# Patient Record
Sex: Male | Born: 1960 | Race: Black or African American | Hispanic: No | Smoking: Former smoker
Health system: Southern US, Community
[De-identification: ages and names within clinical notes are randomized; demographics above are authoritative.]

## PROBLEM LIST (undated history)

## (undated) DIAGNOSIS — I10 Essential (primary) hypertension: Secondary | ICD-10-CM

---
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
## 2017-12-05 ENCOUNTER — Other Ambulatory Visit: Payer: Self-pay

## 2017-12-05 ENCOUNTER — Encounter (HOSPITAL_COMMUNITY): Payer: Self-pay | Admitting: Emergency Medicine

## 2017-12-05 ENCOUNTER — Observation Stay (HOSPITAL_COMMUNITY)
Admission: EM | Admit: 2017-12-05 | Discharge: 2017-12-06 | Disposition: A | Discharge status: Home / Self Care | Payer: Self-pay | Attending: Oncology | Admitting: Oncology

## 2017-12-05 DIAGNOSIS — T782XXA Anaphylactic shock, unspecified, initial encounter: Secondary | ICD-10-CM | POA: Diagnosis present

## 2017-12-05 DIAGNOSIS — I252 Old myocardial infarction: Secondary | ICD-10-CM | POA: Insufficient documentation

## 2017-12-05 DIAGNOSIS — Z87891 Personal history of nicotine dependence: Secondary | ICD-10-CM | POA: Insufficient documentation

## 2017-12-05 DIAGNOSIS — T886XXA Anaphylactic reaction due to adverse effect of correct drug or medicament properly administered, initial encounter: Principal | ICD-10-CM | POA: Insufficient documentation

## 2017-12-05 DIAGNOSIS — T464X5A Adverse effect of angiotensin-converting-enzyme inhibitors, initial encounter: Secondary | ICD-10-CM | POA: Insufficient documentation

## 2017-12-05 DIAGNOSIS — I10 Essential (primary) hypertension: Secondary | ICD-10-CM | POA: Insufficient documentation

## 2017-12-05 DIAGNOSIS — T783XXA Angioneurotic edema, initial encounter: Secondary | ICD-10-CM

## 2017-12-05 DIAGNOSIS — I2510 Atherosclerotic heart disease of native coronary artery without angina pectoris: Secondary | ICD-10-CM | POA: Insufficient documentation

## 2017-12-05 DIAGNOSIS — X58XXXA Exposure to other specified factors, initial encounter: Secondary | ICD-10-CM | POA: Insufficient documentation

## 2017-12-05 HISTORY — DX: Essential (primary) hypertension: I10

## 2017-12-05 LAB — BASIC METABOLIC PANEL
ANION GAP: 6 (ref 5–15)
BUN: 12 mg/dL (ref 6–20)
CHLORIDE: 109 mmol/L (ref 101–111)
CO2: 23 mmol/L (ref 22–32)
CREATININE: 1.09 mg/dL (ref 0.61–1.24)
Calcium: 8.6 mg/dL — ABNORMAL LOW (ref 8.9–10.3)
GFR calc non Af Amer: >60 mL/min (ref >60)
Glucose, Bld: 119 mg/dL — ABNORMAL HIGH (ref 65–99)
POTASSIUM: 4.3 mmol/L (ref 3.5–5.1)
Sodium: 138 mmol/L (ref 135–145)

## 2017-12-05 LAB — CBC WITH DIFFERENTIAL/PLATELET
ABS IMMATURE GRANULOCYTES: 0 10*3/uL (ref 0.0–0.1)
BASOS ABS: 0.1 10*3/uL (ref 0.0–0.1)
Basophils Relative: 1 %
EOS PCT: 1 %
Eosinophils Absolute: 0.1 10*3/uL (ref 0.0–0.7)
HEMATOCRIT: 49 % (ref 39.0–52.0)
HEMOGLOBIN: 15.6 g/dL (ref 13.0–17.0)
IMMATURE GRANULOCYTES: 0 %
LYMPHS ABS: 2.1 10*3/uL (ref 0.7–4.0)
LYMPHS PCT: 25 %
MCH: 27.1 pg (ref 26.0–34.0)
MCHC: 31.8 g/dL (ref 30.0–36.0)
MCV: 85.2 fL (ref 78.0–100.0)
Monocytes Absolute: 0.6 10*3/uL (ref 0.1–1.0)
Monocytes Relative: 8 %
NEUTROS ABS: 5.6 10*3/uL (ref 1.7–7.7)
NEUTROS PCT: 65 %
Platelets: 249 10*3/uL (ref 150–400)
RBC: 5.75 MIL/uL (ref 4.22–5.81)
RDW: 15.9 % — ABNORMAL HIGH (ref 11.5–15.5)
WBC: 8.4 10*3/uL (ref 4.0–10.5)

## 2017-12-05 MED ORDER — METHYLPREDNISOLONE SODIUM SUCC 125 MG IJ SOLR
125.0000 mg | Freq: Once | INTRAMUSCULAR | Status: AC
  Administered 2017-12-05: 125 mg via INTRAVENOUS
  Filled 2017-12-05: qty 2

## 2017-12-05 MED ORDER — DIPHENHYDRAMINE HCL 50 MG/ML IJ SOLN
25.0000 mg | Freq: Once | INTRAMUSCULAR | Status: AC
  Administered 2017-12-05: 25 mg via INTRAVENOUS
  Filled 2017-12-05: qty 1

## 2017-12-05 MED ORDER — ENOXAPARIN SODIUM 40 MG/0.4ML ~~LOC~~ SOLN
40.0000 mg | SUBCUTANEOUS | Status: DC
  Administered 2017-12-05: 40 mg via SUBCUTANEOUS
  Filled 2017-12-05: qty 0.4

## 2017-12-05 MED ORDER — ACETAMINOPHEN 500 MG PO TABS
1000.0000 mg | ORAL_TABLET | Freq: Four times a day (QID) | ORAL | Status: DC | PRN
  Administered 2017-12-05 – 2017-12-06 (×2): 1000 mg via ORAL
  Filled 2017-12-05 (×2): qty 2

## 2017-12-05 MED ORDER — KETOROLAC TROMETHAMINE 15 MG/ML IJ SOLN
15.0000 mg | Freq: Once | INTRAMUSCULAR | Status: AC
  Administered 2017-12-05: 15 mg via INTRAVENOUS
  Filled 2017-12-05: qty 1

## 2017-12-05 MED ORDER — HYDRALAZINE HCL 20 MG/ML IJ SOLN
5.0000 mg | INTRAMUSCULAR | Status: DC | PRN
  Administered 2017-12-05 – 2017-12-06 (×3): 5 mg via INTRAVENOUS
  Filled 2017-12-05 (×3): qty 1

## 2017-12-05 MED ORDER — SODIUM CHLORIDE 0.9 % IV SOLN
INTRAVENOUS | Status: DC
  Administered 2017-12-05 – 2017-12-06 (×2): via INTRAVENOUS

## 2017-12-05 MED ORDER — HYDROCHLOROTHIAZIDE 25 MG PO TABS
25.0000 mg | ORAL_TABLET | Freq: Every day | ORAL | Status: DC
  Administered 2017-12-05 – 2017-12-06 (×2): 25 mg via ORAL
  Filled 2017-12-05 (×2): qty 1

## 2017-12-05 MED ORDER — PROCHLORPERAZINE EDISYLATE 10 MG/2ML IJ SOLN
10.0000 mg | Freq: Once | INTRAMUSCULAR | Status: AC
  Administered 2017-12-05: 10 mg via INTRAVENOUS
  Filled 2017-12-05: qty 2

## 2017-12-05 MED ORDER — ACETAMINOPHEN 325 MG PO TABS
650.0000 mg | ORAL_TABLET | Freq: Four times a day (QID) | ORAL | Status: DC | PRN
  Administered 2017-12-05: 650 mg via ORAL
  Filled 2017-12-05: qty 2

## 2017-12-05 MED ORDER — HYDRALAZINE HCL 20 MG/ML IJ SOLN
10.0000 mg | Freq: Once | INTRAMUSCULAR | Status: AC
  Administered 2017-12-05: 10 mg via INTRAVENOUS
  Filled 2017-12-05: qty 1

## 2017-12-05 MED ORDER — FAMOTIDINE IN NACL 20-0.9 MG/50ML-% IV SOLN
20.0000 mg | Freq: Once | INTRAVENOUS | Status: AC
  Administered 2017-12-05: 20 mg via INTRAVENOUS
  Filled 2017-12-05: qty 50

## 2017-12-05 NOTE — Consult Note (Signed)
Reason for Consult:airway swelling Referring Physician: er  Ephrem Rogers is an 57 y.o. male.  HPI: hx of throat swelling starting about 12 hours ago.  He took a new blood pressure medication of lisinopril from a cousin.  He began having difficulty breathing and change in his voice.  His breathing since onset is substantially better.  His voice still is a little bit hot potato sounding.  He feels a little bit like he cannot swallow well.  He presented to the emergency room with some tongue swelling and palate swelling evident.  He is been given angioedema medications.  He has not had any worsening in the condition in over 6 hours and in fact subjectively he is improved  Past Medical History:  Diagnosis Date  . Hypertension     Past Surgical History:  Procedure Laterality Date  . ORBITAL RECONSTRUCTION Right 1981    No family history on file.  Social History:  reports that he quit smoking about 17 months ago. His smoking use included cigarettes. He has never used smokeless tobacco. He reports that he does not drink alcohol or use drugs.  Allergies:  Allergies  Allergen Reactions  . Lisinopril Swelling    Angioedema     Medications: I have reviewed the patient's current medications.  Results for orders placed or performed during the hospital encounter of 12/05/17 (from the past 48 hour(s))  CBC with Differential     Status: Abnormal   Collection Time: 12/05/17  9:05 AM  Result Value Ref Range   WBC 8.4 4.0 - 10.5 K/uL   RBC 5.75 4.22 - 5.81 MIL/uL   Hemoglobin 15.6 13.0 - 17.0 g/dL   HCT 49.0 39.0 - 52.0 %   MCV 85.2 78.0 - 100.0 fL   MCH 27.1 26.0 - 34.0 pg   MCHC 31.8 30.0 - 36.0 g/dL   RDW 15.9 (H) 11.5 - 15.5 %   Platelets 249 150 - 400 K/uL   Neutrophils Relative % 65 %   Neutro Abs 5.6 1.7 - 7.7 K/uL   Lymphocytes Relative 25 %   Lymphs Abs 2.1 0.7 - 4.0 K/uL   Monocytes Relative 8 %   Monocytes Absolute 0.6 0.1 - 1.0 K/uL   Eosinophils Relative 1 %   Eosinophils  Absolute 0.1 0.0 - 0.7 K/uL   Basophils Relative 1 %   Basophils Absolute 0.1 0.0 - 0.1 K/uL   Immature Granulocytes 0 %   Abs Immature Granulocytes 0.0 0.0 - 0.1 K/uL    Comment: Performed at [facility], [address], [place], [place] [address]  Basic metabolic panel     Status: Abnormal   Collection Time: 12/05/17  9:05 AM  Result Value Ref Range   Sodium 138 135 - 145 mmol/L   Potassium 4.3 3.5 - 5.1 mmol/L   Chloride 109 101 - 111 mmol/L   CO2 23 22 - 32 mmol/L   Glucose, Bld 119 (H) 65 - 99 mg/dL   BUN 12 6 - 20 mg/dL   Creatinine, Ser 1.09 0.61 - 1.24 mg/dL   Calcium 8.6 (L) 8.9 - 10.3 mg/dL   GFR calc non Af Amer >60 >60 mL/min   GFR calc Af Amer >60 >60 mL/min    Comment: (NOTE) The eGFR has been calculated using the CKD EPI equation. This calculation has not been validated in all clinical situations. eGFR's persistently <60 mL/min signify possible Chronic Kidney Disease.    Anion gap 6 5 - 15  Comment: Performed at [facility], [place] [address], [place], [place] [address]    No results found.  Review of Systems  Constitutional: Negative.   HENT: Negative.   Eyes: Negative.   Respiratory: Negative.   Cardiovascular: Negative.   Skin: Negative.    Blood pressure (!) 149/109, pulse 77, temperature 97.9 F (36.6 C), temperature source Oral, resp. rate 15, height 5' 11"  (1.803 m), weight 114.3 kg (252 lb), SpO2 100 %. Physical Exam  Constitutional: He appears well-developed and well-nourished.  HENT:  Head: Normocephalic and atraumatic.  He is awake and alert.  His breathing is a little bit mucousy sounding occasionally.  There is no stridor.  His voice sounds normal from a glottic point review but he sounds like he has a slight hot potato sound.  He was informed risk, options, and benefits of a fiberoptic exam of his airway.  All his questions were answered and consent was obtained.  Fiberoptic exam reveals some thickening of the soft palate from  posterior view.  There is a lot of mucus.  Once past the nasopharynx soft palate region the lateral pharyngeal wall piriform sinus and glottis all look normal.  The base of tongue also looks normal.  He tolerated that procedure well.  His oral pharyngeal exam there is no significant tongue edema.  Floor mouth looks with a slight amount of swelling.  The uvula is thickened and slightly water appearance but not excessively large.  He opens his mouth normally.  Neck is without swelling  Eyes: Pupils are equal, round, and reactive to light. Conjunctivae are normal.  Neck: Normal range of motion. Neck supple.    Assessment/Plan: Angioedema-his fiberoptic exam did not reveal any evidence of glottic swelling.  Vocal cords look normal.  Epiglottis looks normal.  It does appear he has a lot of swelling in his palate region as the scope was coming down from the nasopharynx.  Once past that its open.  Base of tongue looks normal.  It would appear that he has angioedema but it is improving.  Is now 12 hours out from onset so it is hopefully unlikely he will progress from this point.  Observation is appropriate for timeframe in which the patient feels comfortable with his swallowing/swelling.  No intervention is necessary at this point from my standpoint.  Melissa Montane 12/05/2017, 10:12 AM

## 2017-12-05 NOTE — ED Triage Notes (Signed)
Patient to ED c/o neck swelling onset last night - states he has hx HTN and took a friend's lisinopril yesterday. Throat/neck very swollen, voice muffled, pt reports trouble swallowing and breathing. Resp e/u, skin warm/dry.

## 2017-12-05 NOTE — H&P (Signed)
Date: 12/05/2017               Patient Name:  Jeffrey Rogers MRN: [id number]  DOB: 12/27/1960 Age / Sex: 57 y.o., male   PCP: Patient, No Pcp Per         Medical Service: Internal Medicine Teaching Service         Attending Physician: Dr. Levert FeinsteinGranfortuna, James M, MD    First Contact: Dr. Crista ElliotHarbrecht Pager: [phone]  Second Contact: Dr. Obie DredgeBlum Pager: [phone]       After Hours (After 5p/  First Contact Pager: [phone]  weekends / holidays): Second Contact Pager: [phone]   Chief Complaint: "I couldn't breath because my throat was swelling shut"  History of Present Illness: Jeffrey Rogers is a 57 yo M with a PMHx of HTN who presented with 24hr history of throat swelling, dyspnea, nausea and vomiting x 1 episode. He stated that he was in his usual state of health until he developed a headache the day prior. He visited a local pharmacy where his BP was noted to be elevated in the 150's systolic. He had run short of his HCTZ and Lisinopril after moving to [place] from [place] two weeks prior. His cousin gave him two of her lisinopril 10mg  tablets for his HTN. Approximately one hour later he developed throat swelling and dyspnea. However, the patient also endorsed prolonged exposure to insect spray that he had attempted to use on a wasp that was flying around his house. He applied copious amounts of the product to the wasp such that he inhaled a significant quantity to cause nausea for which he laid down for a nap to resolve. This preceded the consumption of the lisinopril by about 2 hours. He denied exposure to unusual foods but endorsed trying a new seasoning on his food about 5 hours prior to his symptoms onset. The patient denied insect bites.   The patient denied fever, chills, abdominal pain, diarrhea, constipation, myalgias, joint pain, visual changes, persistent headache, rash but endorsed urticaria of the face and throat.  In addition he denied swelling of the lips, tongue or face.  In the ED the patient  was treated with diphenhydramine, famotidine, methylprednisolone.  CBC was unremarkable, BMP stable electrolytes slight elevation the glucose but otherwise unremarkable, and C1 esterase inhibitor and HIV antibody 1 process.  CXR and EKG were not performed. Dr. Jearld FentonByers with ENT performed a fiberoptic exam and did not discover any notable concerning features. He recommended observation given the lack of evidence supporting glottic swelling.  He does have S looks normal, vocal cords look normal, with notable swelling of the palate region but is not identified swelling of the tongue.  MTS was called for admission for observation.   Meds:  HCTZ 25mg  daily Lisinopril 20mg  daily  Allergies: Allergies as of 12/05/2017 - Review Complete 12/05/2017  Allergen Reaction Noted  . Lisinopril Swelling 12/05/2017   Past Medical History:  Diagnosis Date  . Hypertension     Family History:  Father- HTN, DMII Brother- HTN Sister- HTN  Social History:  Denied EtOH use in the past year or more than socially drinking Endorsed use of suboxone 2mg  strip every other day to "improve energy" Tobacco use: 30ppd x 30 years, quit one year ago  Review of Systems: A complete ROS was negative except as per HPI.   Physical Exam: Blood pressure (!) 170/114, pulse 78, temperature 97.9 F (36.6 C), temperature source Oral, resp. rate 20, height 5\' 11"  (1.803 m),  weight 252 lb (114.3 kg), SpO2 99 %. Physical Exam  Constitutional: He is oriented to person, place, and time. He appears well-developed and well-nourished. No distress.  HENT:  Head: Normocephalic and atraumatic.  Cardiovascular: Normal rate, regular rhythm and intact distal pulses.  Pulmonary/Chest: Accessory muscle usage present. No stridor. Tachypnea noted. He is in respiratory distress (Requiring BiPAP due to increased work of breathing). He has wheezes in the right upper field and the left upper field.  Abdominal: Soft. Bowel sounds are normal. He  exhibits no distension.  Musculoskeletal: He exhibits no edema.  Neurological: He is alert and oriented to person, place, and time.  Skin: Skin is warm. He is not diaphoretic.  Psychiatric: He has a normal mood and affect.  Vitals reviewed.  EKG: personally reviewed my interpretation is an EKG was not performed   CXR: personally reviewed my interpretation is that a CXR was not performed.   Assessment & Plan by Problem: Active Problems:   Anaphylactic reaction  Assessment: Jeffrey Rogers is a 57 yo M with a PMHx of HTN who presented with 24hr history of throat swelling, dyspnea and dyspnea. His presentation is concerning for anaphylaxis vs angioedema. Given the reported history of toxin exposure and the current symptomatology I am more concerned that this may be an anaphylactic reaction as opposed to angioedema. Additionally, as there was notable absence of edema of the face, lips, tongue and with the associated urticaria I feel less likely that this may be angioedema but given the evaluation by ENT this can not be ruled out. The patient responded well to IV methylprednisolone 125mg  and diphenhydramine.   Plan: Anaphylaxis:  Injury versus anaphylaxis.  Appears to be more reflected in nature.  His angioedema is treated with removal of the offending agent and maintaining patent airway we will continue to observe the patient is improved.  With angioedema we would expect improvement within 24 to 72 hours times evening and gently removed.  Although concerning for anaphylaxis, as the patient improved notably since onset of his symptoms and is tachycardic with hypertension do not feel epinephrine advisable at this time. -Observe the patient overnight on telemetry -Vitals routine -Consider epinephrine 0.5 mg IM if patient decompensates further. -Lisinopril added to patient's list of allergies with reaction of possible angioedema  HTN: Patient has history of poorly controlled hypertension.  His most  recently moved from [place] to [place] and no longer has a PCP.  Associated with medications.  We will establish the patient with primary care provider prior to discharge and refill 1 month supply of his hydrochlorothiazide and consider starting the patient on amlodipine 5 mg daily. -Hydrochlorothiazide 25 mg --Hydralazine 5mg  Q4 hours PRN for Sys BP >180 or diastolic BP >110  Diet: Clear liquids as tolerated Code: Full Fluids: N/A GI PPX: N/A VTE PPX: Enoxaparin  Dispo: Admit patient to Observation with expected length of stay less than 2 midnights.  Signed: Lanelle Bal, MD 12/05/2017, 12:57 PM  Pager: Pager# [phone]

## 2017-12-05 NOTE — ED Provider Notes (Signed)
[facility] Provider Note   CSN: [id number] Arrival date & time: 12/05/17  0749     History   Chief Complaint Chief Complaint  Patient presents with  . Angioedema    HPI Jeffrey Rogers is a 57 y.o. male.  HPI Jeffrey Rogers is a 57 y.o. male with hx of htn, presents to ED with complaint of throat swelling.  Patient states swelling started approximately 9:30 PM last night and worsened until about midnight.  Since then it has been about the same.  He states he ate a sandwich just prior to the swelling starting.  He also states he took 2 of someone else's lisinopril's because his blood pressure was elevated.  He is supposed to be on blood pressure medications but he does not have any at this time.   He states he did not take any medications prior to coming in.  He states he is having trouble breathing and swallowing.  No history of similar reaction in the past.  Denies any pain.  Past Medical History:  Diagnosis Date  . Hypertension     There are no active problems to display for this patient.   Past Surgical History:  Procedure Laterality Date  . ORBITAL RECONSTRUCTION Right 1981        Home Medications    Prior to Admission medications   Not on File    Family History No family history on file.  Social History Social History   Tobacco Use  . Smoking status: Former Smoker    Types: Cigarettes    Last attempt to quit: 2018    Years since quitting: 1.4  . Smokeless tobacco: Never Used  Substance Use Topics  . Alcohol use: Never    Frequency: Never  . Drug use: Never     Allergies   Lisinopril   Review of Systems Review of Systems  Constitutional: Negative for chills and fever.  HENT: Positive for facial swelling, trouble swallowing and voice change. Negative for sore throat.   Respiratory: Negative for cough, chest tightness and shortness of breath.   Cardiovascular: Negative for chest pain, palpitations and leg  swelling.  Gastrointestinal: Negative for abdominal distention, abdominal pain, diarrhea, nausea and vomiting.  Musculoskeletal: Negative for arthralgias, myalgias, neck pain and neck stiffness.  Skin: Negative for rash.  Allergic/Immunologic: Negative for immunocompromised state.  Neurological: Negative for dizziness, weakness, light-headedness, numbness and headaches.  All other systems reviewed and are negative.    Physical Exam Updated Vital Signs BP (!) 149/109 (BP Location: Right Arm)   Pulse 77   Temp 97.9 F (36.6 C) (Oral)   Resp 15   Ht 5\' 11"  (1.803 m)   Wt 114.3 kg (252 lb)   SpO2 100%   BMI 35.15 kg/m   Physical Exam  Constitutional: He appears well-developed and well-nourished. No distress.  Muffled voice  HENT:  Head: Normocephalic and atraumatic.  No obvious tongue swelling. Uvula edematous, midline. Significant submandibular fullness and swelling.   Eyes: Conjunctivae are normal.  Neck: Normal range of motion. Neck supple.  Cardiovascular: Normal rate, regular rhythm and normal heart sounds.  Pulmonary/Chest: Effort normal. No respiratory distress. He has no wheezes. He has no rales.  Abdominal: Soft. Bowel sounds are normal. He exhibits no distension. There is no tenderness. There is no rebound.  Musculoskeletal: He exhibits no edema.  Neurological: He is alert.  Skin: Skin is warm and dry.  Nursing note and vitals reviewed.    ED Treatments /  Results  Labs (all labs ordered are listed, but only abnormal results are displayed) Labs Reviewed  CBC WITH DIFFERENTIAL/PLATELET - Abnormal; Notable for the following components:      Result Value   RDW 15.9 (*)    All other components within normal limits  BASIC METABOLIC PANEL - Abnormal; Notable for the following components:   Glucose, Bld 119 (*)    Calcium 8.6 (*)    All other components within normal limits  C1 ESTERASE INHIBITOR    EKG None  Radiology No results  found.  Procedures Procedures (including critical care time)  Medications Ordered in ED Medications  methylPREDNISolone sodium succinate (SOLU-MEDROL) 125 mg/2 mL injection 125 mg (125 mg Intravenous Given 12/05/17 0851)  diphenhydrAMINE (BENADRYL) injection 25 mg (25 mg Intravenous Given 12/05/17 0851)  famotidine (PEPCID) IVPB 20 mg premix (0 mg Intravenous Stopped 12/05/17 0921)     Initial Impression / Assessment and Plan / ED Course  I have reviewed the triage vital signs and the nursing notes.  Pertinent labs & imaging results that were available during my care of the patient were reviewed by me and considered in my medical decision making (see chart for details).     Pt in ED with complaint of throat swelling. Significant submandibular swelling with uvula edema. Pt is able to swallow secretions. voisce is muffled. Will give solumedrol, benadryl, pepcid. Will consult ENT to evaluate for swelling of airway.   9:40 AM Spoke with Dr. Jearld Fenton, will come see him.   11:01 AM Patient seen by Dr. Jearld Fenton with ear nose throat, swelling seems to be not extending into vocal cords or proximal airway. Pt has not gotten any worse since being in ED. Will admit for further observation.   Spoke with family medicine, will admit.   Vitals:   12/05/17 0755 12/05/17 0919  BP: (!) 149/109   Pulse: 78 77  Resp: 18 15  Temp: 97.9 F (36.6 C)   TempSrc: Oral   SpO2: 97% 100%  Weight: 114.3 kg (252 lb)   Height: 5\' 11"  (1.803 m)      Final Clinical Impressions(s) / ED Diagnoses   Final diagnoses:  Angiotensin converting enzyme inhibitor-aggravated angioedema, initial encounter    ED Discharge Orders    None       Jaynie Crumble, PA-C 12/05/17 1102    Gwyneth Sprout, MD 12/06/17 1539

## 2017-12-05 NOTE — ED Notes (Signed)
Dr. Jearld FentonByers in room for procedure at this time.

## 2017-12-05 NOTE — ED Provider Notes (Signed)
8:11 AM  Sonda Rumblenthony Dayal is a 57 y.o. male presented to ED with throat sweeling. I was asked to screen him in triage. Pt with submandibular and throat swelling since yesterday evening after eating a sandwitch.  Patient also states that he took someone elses lisinopril for blood pressure control last night. States minimal pain, just difficult to swallow. No breathing problems, just states "throat feels full."  On exam, swelling over sumbandibular area, tongue, throat. No stridor. Swallowing own secretions. Differential includes lisinopril angioedema vs allergic angioedema vs ludwigs angina. Will order solumedrol, benadryl, pepcid. Asked RN to find pt a room asap.   Vitals:   12/05/17 0755  BP: (!) 149/109  Pulse: 78  Resp: 18  Temp: 97.9 F (36.6 C)  TempSrc: Oral  SpO2: 97%  Weight: 114.3 kg (252 lb)  Height: 5\' 11"  (1.803 m)       Jaynie CrumbleKirichenko, Michaeal Davis, PA-C 12/05/17 0815    Gwyneth SproutPlunkett, Whitney, MD 12/07/17 2249

## 2017-12-06 LAB — HIV ANTIBODY (ROUTINE TESTING W REFLEX): HIV SCREEN 4TH GENERATION: NONREACTIVE

## 2017-12-06 MED ORDER — EPINEPHRINE 0.3 MG/0.3ML IJ SOAJ: 0.3000 mg | Freq: Once | INTRAMUSCULAR | 2 refills | Status: AC

## 2017-12-06 MED ORDER — AMLODIPINE BESYLATE 5 MG PO TABS: 5.0000 mg | ORAL_TABLET | Freq: Every day | ORAL | 1 refills | Status: AC

## 2017-12-06 MED ORDER — HYDROCHLOROTHIAZIDE 25 MG PO TABS: 25.0000 mg | ORAL_TABLET | Freq: Every day | ORAL | 1 refills | Status: AC

## 2017-12-06 NOTE — Progress Notes (Signed)
Patient continues to complain of headache and BP continues to be elevated SBP 150-180, DBP > 110. Internal Medicine Teaching Services notified about BP and continued headache.  Provider placed orders for Toradol 15mg  for headache without relief.  RN paged again for continued headache and BP  162/118; benadryl 25 mg, Compazine 10 mg, and hydralazine 10 mg. Patient reports no improvement of headache, BP 179/114.  Patient continues to tell RN that "this hospital is a joke." RN continues to educate patient on why it is important to slowly decrease BP to keep from causing hypotension.  Patient asleep before RN leaves the room.  Will continue to monitor patient.

## 2017-12-06 NOTE — Discharge Summary (Signed)
Name: Jeffrey Rogers MRN: [id number] DOB: 10/17/1960 57 y.o. PCP: Patient, No Pcp Per  Date of Admission: 12/05/2017  7:50 AM Date of Discharge: 12/06/2017 Attending Physician: Dr. Cephas Darby   Discharge Diagnosis: 1. Anaphylaxis  2. Hypertension  Discharge Medications: Allergies as of 12/06/2017      Reactions   Lisinopril Swelling   Angioedema      Medication List    STOP taking these medications   lisinopril 10 MG tablet Commonly known as:  PRINIVIL,ZESTRIL     TAKE these medications   amLODipine 5 MG tablet Commonly known as:  NORVASC Take 1 tablet (5 mg total) by mouth daily.   EPINEPHrine 0.3 mg/0.3 mL Soaj injection Commonly known as:  EPI-PEN Inject 0.3 mLs (0.3 mg total) into the muscle once for 1 dose.   hydrochlorothiazide 25 MG tablet Commonly known as:  HYDRODIURIL Take 1 tablet (25 mg total) by mouth daily.      Disposition and follow-up:   Jeffrey Rogers was discharged from [facility] in Stable condition.  At the hospital follow up visit please address:  1.  Anaphylaxis: Please assess the patient for appropriateness of an Epi pen given the potential of this being a reaction to an inhalent vs angioedema. He should refrain from utilizing ACEi in the future either way.     HTN: poorly controlled. Will need continued medication titration on the outpatient setting. Started back on HCTZ 25mg  and amlodipine 5mg .   2.  Labs / imaging needed at time of follow-up: BMP scheduled as we restarted HCTZ  3.  Pending labs/ test needing follow-up: C1 Esterase Inhibitor   Follow-up Appointments: Follow-up Information    [facility]. Call.   Why:  We will request that someone call you with an apointment on Monday. If you have not heard from someone by Tuesday, give the number above a call.  Contact information: [address] [place] [address] [phone]         Hospital Course by problem  list: Anaphylaxis: Jeffrey Rogers is a 57 yo M with a PMHx of HTN who presented with 24hr history of throat swelling, dyspnea and dyspnea. His presentation was concerning for anaphylaxis vs angioedema. Given the reported history of toxin exposure and his presenting symptomatology I am more concerned that this may be an anaphylactic reaction as opposed to angioedema. Additionally, as there was notable absence of edema of the face, lips, tongue and with the associated urticaria I feel less likely that this may be angioedema but given the evaluation by ENT this can not be ruled out. The patient responded well to IV methylprednisolone 125mg  and diphenhydramine and discontinuation of the agent with time. As the event had occurred 18 hours prior and he had marked improvement he was not treated with IM epinephrine. He continued to improve the following morning and tolerated his meal well without dysphagia. Given his improvement he was determined to be stable for discharge after given extensive advice on the avoidance of ACE inhibitors and the avoiding outdoor bug spray use within a confined area.   HTN: He will need continued monitoring of his blood pressure. Repeat vitals were sadly not recorded by the nursing staff prior to discharge and as such the appropriateness of his current regimen could not ultimately be confirmed.   Discharge Vitals:   BP 117/86 (BP Location: Right Arm)   Pulse 90   Temp 98.3 F (36.8 C) (Oral)   Resp 19  Ht 5\' 11"  (1.803 m)   Wt 252 lb (114.3 kg)   SpO2 97%   BMI 35.15 kg/m   Pertinent Labs, Studies, and Procedures:  CBC Latest Ref Rng & Units 12/05/2017  WBC 4.0 - 10.5 K/uL 8.4  Hemoglobin 13.0 - 17.0 g/dL 96.015.6  Hematocrit 45.439.0 - 52.0 % 49.0  Platelets 150 - 400 K/uL 249   CMP Latest Ref Rng & Units 12/05/2017  Glucose 65 - 99 mg/dL 098(J119(H)  BUN 6 - 20 mg/dL 12  Creatinine 1.910.61 - 4.781.24 mg/dL 2.951.09  Sodium 621135 - 308145 mmol/L 138  Potassium 3.5 - 5.1 mmol/L 4.3  Chloride  101 - 111 mmol/L 109  CO2 22 - 32 mmol/L 23  Calcium 8.9 - 10.3 mg/dL 6.5(H8.6(L)   Dr. Suzanna ObeyJohn Byers fiberscopic and physical exam of the oropharynx: "He is awake and alert.  His breathing is a little bit mucousy sounding occasionally.  There is no stridor.  His voice sounds normal from a glottic point review but he sounds like he has a slight hot potato sound.  He was informed risk, options, and benefits of a fiberoptic exam of his airway.  All his questions were answered and consent was obtained.  Fiberoptic exam reveals some thickening of the soft palate from posterior view.  There is a lot of mucus.  Once past the nasopharynx soft palate region the lateral pharyngeal wall piriform sinus and glottis all look normal.  The base of tongue also looks normal.  He tolerated that procedure well.  His oral pharyngeal exam there is no significant tongue edema.  Floor mouth looks with a slight amount of swelling.  The uvula is thickened and slightly water appearance but not excessively large.  He opens his mouth normally.  Neck is without swelling"  Discharge Instructions: Discharge Instructions    Call MD for:  difficulty breathing, headache or visual disturbances   Complete by:  As directed    Call MD for:  persistant nausea and vomiting   Complete by:  As directed    Call MD for:  temperature >100.4   Complete by:  As directed    Diet - low sodium heart healthy   Complete by:  As directed    Increase activity slowly   Complete by:  As directed      Signed: Lanelle BalHarbrecht, Kamen Hanken, MD 12/06/2017, 12:52 PM   Pager: Pager# [phone]

## 2017-12-06 NOTE — Progress Notes (Signed)
Subjective: The patient was asleep in the room today. He stated that the last treatment for his headache resolved the symptoms and finally allowed him to rest. I again reiterated that mild HTN as he suffered does not cause symptoms despite the common misconception. In addition we educated the patient on the matter of the importance of slowly decreasing HTN and how the data strongly advises against rapid blood pressure control on the inpatient setting. He was advised and agreed to follow with a primary care provider for better long term outpatient blood pressure regulation.   Objective:  Vital signs in last 24 hours: Vitals:   12/05/17 2327 12/05/17 2332 12/06/17 0022 12/06/17 0522  BP: (!) 158/138 (!) 162/118 (!) 179/114 117/86  Pulse:  98 (!) 108 90  Resp:    19  Temp:    98.3 F (36.8 C)  TempSrc:    Oral  SpO2:  95%  97%  Weight:      Height:       Physical Exam  Constitutional: He appears well-developed and well-nourished. No distress.  HENT:  Head: Normocephalic and atraumatic.  Nose: Nose normal.  Mouth/Throat: Oropharynx is clear and moist. No oropharyngeal exudate.  Eyes: Conjunctivae and EOM are normal.  Neck: Normal range of motion.  Cardiovascular: Normal rate and regular rhythm.  No murmur heard. Pulmonary/Chest: Effort normal and breath sounds normal. No stridor. No respiratory distress.  Abdominal: Soft. Bowel sounds are normal. He exhibits no distension.  Musculoskeletal: He exhibits no edema.  Neurological: He is alert.  Skin: Skin is warm. He is not diaphoretic.  Psychiatric: He has a normal mood and affect.  Vitals reviewed.  Assessment/Plan:  Active Problems:   Anaphylactic reaction  Assessment: Jeffrey Rogers is a 57 yo M with a PMHx of HTN who presented with 24hr history of throat swelling, dyspnea and dyspnea. His presentation is concerning for anaphylaxis vs angioedema. Given the reported history of toxin exposure and the current symptomatology I am  more concerned that this may be an anaphylactic reaction as opposed to angioedema. Additionally, as there was notable absence of edema of the face, lips, tongue and with the associated urticaria I feel less likely that this may be angioedema but given the evaluation by ENT this can not be ruled out. The patient responded well to IV methylprednisolone 125mg  and diphenhydramine. He continued to improve the following morning and tolerated his meal well without dysphagia. Given his improvement he was determined to be stable for discharge after given extensive advice on the avoidance of ACE inhibitors and the use of outdoor bug spray within a confined area.   Plan: Anaphylaxis:  Angioedema versus anaphylaxis. As angioedema is treated with removal of the offending agent and maintaining a patent airway we will continue to observe the patient for improvement.  With angioedema we would expect improvement within 24 to 72 hours after the offending agent is removed removed.  Although concerning for anaphylaxis, as the patient improved notably since onset of his symptoms and was not tachycardic with hypertension do not feel epinephrine was advisable. -Observe the patient overnight on telemetry -Vitals routine -Consider epinephrine 0.5 mg IM if patient decompensates further. -Lisinopril added to patient's list of allergies with reaction of possible angioedema  HTN: Patient has history of poorly controlled hypertension.  His most recently [place] to [place] and no longer has a PCP.  Associated with medications.  We will establish the patient with primary care provider prior to discharge and  refill 1 month supply of his hydrochlorothiazide and consider starting the patient on amlodipine 5 mg daily. -Hydrochlorothiazide 25 mg --Amlodipine 5mg  daily, will need titrated on the outpatient setting --Hydralazine 5mg  Q4 hours PRN for Sys BP >180 or diastolic BP >110 --To establish with Internal Medicine  Center for his primary care  Diet: Regular Code: Full Fluids: N/A GI PPX: N/A VTE PPX: Enoxaparin 40mg  daily Dispo: Anticipated discharge in approximately 0 day(s).   Lanelle Bal, MD 12/06/2017, 6:49 AM Pager: Pager# [phone]

## 2017-12-06 NOTE — Progress Notes (Signed)
Jeffrey Rogers to be D/C'd Home per MD order.  Discussed prescriptions and follow up appointments with the patient. Prescriptions given to patient, medication list explained in detail. Pt verbalized understanding.  Allergies as of 12/06/2017      Reactions   Lisinopril Swelling   Angioedema      Medication List    STOP taking these medications   lisinopril 10 MG tablet Commonly known as:  PRINIVIL,ZESTRIL     TAKE these medications   amLODipine 5 MG tablet Commonly known as:  NORVASC Take 1 tablet (5 mg total) by mouth daily.   EPINEPHrine 0.3 mg/0.3 mL Soaj injection Commonly known as:  EPI-PEN Inject 0.3 mLs (0.3 mg total) into the muscle once for 1 dose.   hydrochlorothiazide 25 MG tablet Commonly known as:  HYDRODIURIL Take 1 tablet (25 mg total) by mouth daily.       Vitals:   12/06/17 0022 12/06/17 0522  BP: (!) 179/114 117/86  Pulse: (!) 108 90  Resp:  19  Temp:  98.3 F (36.8 C)  SpO2:  97%    Skin clean, dry and intact without evidence of skin break down, no evidence of skin tears noted. IV catheter discontinued intact. Site without signs and symptoms of complications. Dressing and pressure applied. Pt denies pain at this time. No complaints noted.  An After Visit Summary was printed and given to the patient. Patient escorted via WC, and D/C home via private auto.  Jeffrey Rogers BSN, RN [facility] Phone [phone]

## 2017-12-06 NOTE — Care Management (Signed)
Pt has no PCP.  Internal Medicine Clinic will call patient with appointment tomorrow.

## 2017-12-06 NOTE — Discharge Instructions (Signed)
Please avoid lisinopril or other similar medications known as ACE inhibitors. They will have endings similar to lisinopril such as -pril. Also, please avoid the bug spray that you used in your house the day of admission.  If these symptoms ever occur again please go directly to the emergency department right away. We will have you establish with our clinic here at [facility] [place] [phone]). Please feel free to call us with any questions any time 24/7.   We have provided you with a script for hydrochlorothiazide and Amlodipine for a few months but you will need to follow with our clinic or a clinic somewhere if you so chose to continue close monitoring and treatment of your high blood pressure and continued refills now that you are moving to [place].   Thank you for your visit to the [facility] . If you have any concerns let us know.

## 2017-12-07 LAB — C1 ESTERASE INHIBITOR: C1INH SerPl-mCnc: 30 mg/dL (ref 21–39)

## 2017-12-18 ENCOUNTER — Ambulatory Visit: Payer: Self-pay

## 2017-12-18 ENCOUNTER — Encounter: Payer: Self-pay | Admitting: General Practice

## 2018-03-10 ENCOUNTER — Other Ambulatory Visit (HOSPITAL_BASED_OUTPATIENT_CLINIC_OR_DEPARTMENT_OTHER): Payer: Self-pay

## 2018-03-10 DIAGNOSIS — R0683 Snoring: Secondary | ICD-10-CM

## 2018-03-10 DIAGNOSIS — G4709 Other insomnia: Secondary | ICD-10-CM

## 2018-03-10 DIAGNOSIS — G2581 Restless legs syndrome: Secondary | ICD-10-CM

## 2018-03-10 DIAGNOSIS — G4710 Hypersomnia, unspecified: Secondary | ICD-10-CM

## 2018-03-10 DIAGNOSIS — R454 Irritability and anger: Secondary | ICD-10-CM

## 2018-03-10 DIAGNOSIS — R5383 Other fatigue: Secondary | ICD-10-CM

## 2018-03-10 DIAGNOSIS — G4730 Sleep apnea, unspecified: Secondary | ICD-10-CM

## 2018-04-17 ENCOUNTER — Ambulatory Visit (HOSPITAL_BASED_OUTPATIENT_CLINIC_OR_DEPARTMENT_OTHER): Payer: Self-pay | Attending: *Deleted | Admitting: Internal Medicine

## 2018-04-17 VITALS — Ht 71.0 in | Wt 260.0 lb

## 2018-04-17 DIAGNOSIS — R0683 Snoring: Secondary | ICD-10-CM | POA: Insufficient documentation

## 2018-04-17 DIAGNOSIS — R5383 Other fatigue: Secondary | ICD-10-CM | POA: Insufficient documentation

## 2018-04-17 DIAGNOSIS — G4730 Sleep apnea, unspecified: Secondary | ICD-10-CM | POA: Insufficient documentation

## 2018-04-17 DIAGNOSIS — G4700 Insomnia, unspecified: Secondary | ICD-10-CM | POA: Insufficient documentation

## 2018-04-17 DIAGNOSIS — G4733 Obstructive sleep apnea (adult) (pediatric): Secondary | ICD-10-CM

## 2018-04-17 DIAGNOSIS — G4710 Hypersomnia, unspecified: Secondary | ICD-10-CM | POA: Insufficient documentation

## 2018-04-17 DIAGNOSIS — G4709 Other insomnia: Secondary | ICD-10-CM

## 2018-04-17 DIAGNOSIS — R454 Irritability and anger: Secondary | ICD-10-CM | POA: Insufficient documentation

## 2018-04-17 DIAGNOSIS — G2581 Restless legs syndrome: Secondary | ICD-10-CM | POA: Insufficient documentation

## 2018-05-01 DIAGNOSIS — R0683 Snoring: Secondary | ICD-10-CM

## 2018-05-01 NOTE — Procedures (Signed)
Patient Name: Jeffrey Rogers, Jeffrey Rogers Date: 04/17/2018 Gender: Male D.O.B: 22-Dec-1960 Age (years): 11 Referring Provider: Marliss Coots NP Height (inches): 71 Interpreting Physician: Baird Lyons MD, ABSM Weight (lbs): 260 RPSGT: Earney Hamburg BMI: 36 MRN: [id number] Neck Size: 17.50  CLINICAL INFORMATION The patient is referred for a split night study with BPAP. MEDICATIONS Medications self-administered by patient taken the night of the study : none reported  SLEEP STUDY TECHNIQUE As per the AASM Manual for the Scoring of Sleep and Associated Events v2.3 (April 2016) with a hypopnea requiring 4% desaturations.  The channels recorded and monitored were frontal, central and occipital EEG, electrooculogram (EOG), submentalis EMG (chin), nasal and oral airflow, thoracic and abdominal wall motion, anterior tibialis EMG, snore microphone, electrocardiogram, and pulse oximetry. Bi-level positive airway pressure (BiPAP) was initiated when the patient met split night criteria and was titrated according to treat sleep-disordered breathing.  RESPIRATORY PARAMETERS Diagnostic  Total AHI (/hr): 83.9 RDI (/hr): 84.8 OA Index (/hr): 34.6 CA Index (/hr): 1.7 REM AHI (/hr): N/A NREM AHI (/hr): 83.9 Supine AHI (/hr): 89.7 Non-supine AHI (/hr): 78.95 Min O2 Sat (%): 60.0 Mean O2 (%): 91.3 Time below 88% (min): 32.2   Titration  Optimal IPAP Pressure (cm): 19 Optimal EPAP Pressure (cm): 15 AHI at Optimal Pressure (/hr): 0.0 Min O2 at Optimal Pressure (%): 93.0 Sleep % at Optimal (%): 100 Supine % at Optimal (%): 0   SLEEP ARCHITECTURE The study was initiated at 10:14:58 PM and terminated at 4:46:02 AM. The total recorded time was 391.1 minutes. EEG confirmed total sleep time was 301.1 minutes yielding a sleep efficiency of 77.0%%. Sleep onset after lights out was 10.5 minutes with a REM latency of 276.0 minutes. The patient spent 3.7%% of the night in stage N1 sleep, 87.7%% in stage N2  sleep, 0.3%% in stage N3 and 8.3% in REM. Wake after sleep onset (WASO) was 79.5 minutes. The Arousal Index was 35.3/hour.  LEG MOVEMENT DATA The total Periodic Limb Movements of Sleep (PLMS) were 0. The PLMS index was 0.0 .  CARDIAC DATA The 2 lead EKG demonstrated sinus rhythm. The mean heart rate was 100.0 beats per minute. Other EKG findings include: None.  IMPRESSIONS - Severe obstructive sleep apnea occurred during the diagnostic portion of the study (AHI = 83.9 /hour). - CPAP provided insufficient control and titration waas changed to BIPAP. - An optimal BIPAP pressure was selected for this patient ( 19 / 15 cm of water) - Mild central sleep apnea occurred during the diagnostic portion of the study (CAI = 1.7/hour). - Severe oxygen desaturation was noted during the diagnostic portion of the study (Min O2 = 60.0%). Min sat at BIPAP 19/15 was 93%. - The patient snored with loud snoring volume during the diagnostic portion of the study. - No cardiac abnormalities were noted during this study. - Clinically significant periodic limb movements of sleep did not occur during the study.  DIAGNOSIS - Obstructive Sleep Apnea (327.23 [G47.33 ICD-10])  RECOMMENDATIONS - Trial of BiPAP therapy on 19/15 cm H2O. Patient used a Medium size Fisher[employer]Paykel Full Face Simplus mask and heated humidification. - Be carefufl with alcohol, sedatives and other CNS depressants that may worsen sleep apnea and disrupt normal sleep architecture. - Sleep hygiene should be reviewed to assess factors that may improve sleep quality. - Weight management and regular exercise should be initiated or continued.  [Electronically signed] 05/01/2018 09:47 AM  Baird Lyons MD, ABSM Diplomate, American Board of Sleep Medicine  NPI: [id number]                        [place], American Board of Sleep Medicine  ELECTRONICALLY SIGNED ON:  05/01/2018, 9:41 AM [place] PH: [phone]   FX: [phone] [place]

## 2018-07-04 ENCOUNTER — Emergency Department (HOSPITAL_COMMUNITY)
Admission: EM | Admit: 2018-07-04 | Discharge: 2018-07-04 | Disposition: A | Discharge status: Home / Self Care | Payer: Self-pay | Attending: Emergency Medicine | Admitting: Emergency Medicine

## 2018-07-04 ENCOUNTER — Encounter (HOSPITAL_COMMUNITY): Payer: Self-pay | Admitting: Emergency Medicine

## 2018-07-04 ENCOUNTER — Other Ambulatory Visit: Payer: Self-pay

## 2018-07-04 ENCOUNTER — Emergency Department (HOSPITAL_COMMUNITY): Payer: Self-pay

## 2018-07-04 DIAGNOSIS — I10 Essential (primary) hypertension: Secondary | ICD-10-CM | POA: Insufficient documentation

## 2018-07-04 DIAGNOSIS — Z87891 Personal history of nicotine dependence: Secondary | ICD-10-CM | POA: Insufficient documentation

## 2018-07-04 DIAGNOSIS — Z79899 Other long term (current) drug therapy: Secondary | ICD-10-CM | POA: Insufficient documentation

## 2018-07-04 DIAGNOSIS — J209 Acute bronchitis, unspecified: Secondary | ICD-10-CM | POA: Insufficient documentation

## 2018-07-04 DIAGNOSIS — J40 Bronchitis, not specified as acute or chronic: Secondary | ICD-10-CM

## 2018-07-04 LAB — TROPONIN I: Troponin I: <0.03 ng/mL (ref <0.03)

## 2018-07-04 LAB — CBC WITH DIFFERENTIAL/PLATELET
Abs Immature Granulocytes: 0.04 10*3/uL (ref 0.00–0.07)
BASOS ABS: 0.1 10*3/uL (ref 0.0–0.1)
Basophils Relative: 1 %
Eosinophils Absolute: 0.2 10*3/uL (ref 0.0–0.5)
Eosinophils Relative: 2 %
HCT: 45.3 % (ref 39.0–52.0)
Hemoglobin: 14.2 g/dL (ref 13.0–17.0)
Immature Granulocytes: 1 %
LYMPHS ABS: 2.1 10*3/uL (ref 0.7–4.0)
Lymphocytes Relative: 27 %
MCH: 27.3 pg (ref 26.0–34.0)
MCHC: 31.3 g/dL (ref 30.0–36.0)
MCV: 86.9 fL (ref 80.0–100.0)
Monocytes Absolute: 0.6 10*3/uL (ref 0.1–1.0)
Monocytes Relative: 8 %
NEUTROS PCT: 61 %
NRBC: 0 % (ref 0.0–0.2)
Neutro Abs: 4.7 10*3/uL (ref 1.7–7.7)
Platelets: 218 10*3/uL (ref 150–400)
RBC: 5.21 MIL/uL (ref 4.22–5.81)
RDW: 14.9 % (ref 11.5–15.5)
WBC: 7.6 10*3/uL (ref 4.0–10.5)

## 2018-07-04 LAB — COMPREHENSIVE METABOLIC PANEL
ALT: 25 U/L (ref 0–44)
AST: 35 U/L (ref 15–41)
Albumin: 3.7 g/dL (ref 3.5–5.0)
Alkaline Phosphatase: 48 U/L (ref 38–126)
Anion gap: 9 (ref 5–15)
BUN: 14 mg/dL (ref 6–20)
CHLORIDE: 108 mmol/L (ref 98–111)
CO2: 23 mmol/L (ref 22–32)
Calcium: 8.8 mg/dL — ABNORMAL LOW (ref 8.9–10.3)
Creatinine, Ser: 1.51 mg/dL — ABNORMAL HIGH (ref 0.61–1.24)
GFR calc non Af Amer: 51 mL/min — ABNORMAL LOW (ref >60)
GFR, EST AFRICAN AMERICAN: 59 mL/min — AB (ref >60)
Glucose, Bld: 125 mg/dL — ABNORMAL HIGH (ref 70–99)
Potassium: 4.1 mmol/L (ref 3.5–5.1)
SODIUM: 140 mmol/L (ref 135–145)
Total Bilirubin: 0.3 mg/dL (ref 0.3–1.2)
Total Protein: 6.5 g/dL (ref 6.5–8.1)

## 2018-07-04 LAB — BRAIN NATRIURETIC PEPTIDE: B Natriuretic Peptide: 39.6 pg/mL (ref 0.0–100.0)

## 2018-07-04 MED ORDER — ALBUTEROL SULFATE HFA 108 (90 BASE) MCG/ACT IN AERS: 1.0000 | INHALATION_SPRAY | Freq: Four times a day (QID) | RESPIRATORY_TRACT | 0 refills | Status: AC | PRN

## 2018-07-04 MED ORDER — PREDNISONE 20 MG PO TABS: 40.0000 mg | ORAL_TABLET | Freq: Every day | ORAL | 0 refills | Status: DC

## 2018-07-04 MED ORDER — PREDNISONE 20 MG PO TABS
60.0000 mg | ORAL_TABLET | Freq: Once | ORAL | Status: AC
  Administered 2018-07-04: 60 mg via ORAL
  Filled 2018-07-04: qty 3

## 2018-07-04 MED ORDER — ALBUTEROL SULFATE (2.5 MG/3ML) 0.083% IN NEBU
5.0000 mg | INHALATION_SOLUTION | Freq: Once | RESPIRATORY_TRACT | Status: AC
  Administered 2018-07-04: 5 mg via RESPIRATORY_TRACT
  Filled 2018-07-04: qty 6

## 2018-07-04 NOTE — Discharge Instructions (Signed)
As discussed, your evaluation today has been largely reassuring.  But, it is important that you monitor your condition carefully, and do not hesitate to return to the ED if you develop new, or concerning changes in your condition. ? ?Otherwise, please follow-up with your physician for appropriate ongoing care. ? ?

## 2018-07-04 NOTE — ED Triage Notes (Signed)
Pt c/o shortness of breath and a "fluttering" in his chest x 1 day. Shortness of breath worse with exertion. Denies chest pain.

## 2018-07-04 NOTE — ED Provider Notes (Signed)
[facility] EMERGENCY DEPARTMENT Provider Note   CSN: [id number] Arrival date & time: 07/04/18  1855     History   Chief Complaint Chief Complaint  Patient presents with  . Shortness of Breath    HPI Jeffrey Rogers is a 58 y.o. male.  HPI Patient presents concern of dyspnea Symptoms began last night, initially with difficulty sleeping, but over the course the day he has had dyspnea at rest, worse with exertion. No chest pain, though he does have occasional fluttering sensation in his chest. No fever, no cough. Patient denies history of substantial disease beyond hypertension. He did have catheterization, in the distant past, denies stenting. He smokes. No clear precipitant for this illness, and during it, symptoms are worse with activity. He notes weight loss, no weight gain, denies extremity swelling. Past Medical History:  Diagnosis Date  . Hypertension     Patient Active Problem List   Diagnosis Date Noted  . Anaphylactic reaction 12/05/2017    Past Surgical History:  Procedure Laterality Date  . ORBITAL RECONSTRUCTION Right 1981        Home Medications    Prior to Admission medications   Medication Sig Start Date End Date Taking? Authorizing Provider  albuterol (PROVENTIL HFA;VENTOLIN HFA) 108 (90 Base) MCG/ACT inhaler Inhale 1-2 puffs into the lungs every 6 (six) hours as needed for wheezing or shortness of breath. 07/04/18   Gerhard MunchLockwood, Lydell Moga, MD  amLODipine (NORVASC) 5 MG tablet Take 1 tablet (5 mg total) by mouth daily. 12/06/17 12/06/18  Lanelle BalHarbrecht, Lawrence, MD  hydrochlorothiazide (HYDRODIURIL) 25 MG tablet Take 1 tablet (25 mg total) by mouth daily. 12/06/17   Lanelle BalHarbrecht, Lawrence, MD  predniSONE (DELTASONE) 20 MG tablet Take 2 tablets (40 mg total) by mouth daily with breakfast. For the next four days 07/04/18   Gerhard MunchLockwood, Vladislav Axelson, MD    Family History No family history on file.  Social History Social History   Tobacco Use  . Smoking  status: Former Smoker    Types: Cigarettes    Last attempt to quit: 2018    Years since quitting: 2.0  . Smokeless tobacco: Never Used  Substance Use Topics  . Alcohol use: Never    Frequency: Never  . Drug use: Never     Allergies   Lisinopril   Review of Systems Review of Systems  Constitutional:       Per HPI, otherwise negative  HENT:       Per HPI, otherwise negative  Respiratory:       Per HPI, otherwise negative  Cardiovascular:       Per HPI, otherwise negative  Gastrointestinal: Negative for vomiting.  Endocrine:       Negative aside from HPI  Genitourinary:       Neg aside from HPI   Musculoskeletal:       Per HPI, otherwise negative  Skin: Negative.   Neurological: Negative for syncope.     Physical Exam Updated Vital Signs BP 130/85   Pulse 78   Temp 98.1 F (36.7 C) (Oral)   Resp (!) 22   Ht 5\' 11"  (1.803 m)   Wt 117.9 kg   SpO2 100%   BMI 36.26 kg/m   Physical Exam Vitals signs and nursing note reviewed.  Constitutional:      General: He is not in acute distress.    Appearance: He is well-developed.  HENT:     Head: Normocephalic and atraumatic.  Eyes:     Conjunctiva/sclera: Conjunctivae normal.  Cardiovascular:     Rate and Rhythm: Normal rate and regular rhythm.  Pulmonary:     Effort: Tachypnea present.     Breath sounds: Decreased breath sounds present.  Abdominal:     General: There is no distension.  Skin:    General: Skin is warm and dry.  Neurological:     Mental Status: He is alert and oriented to person, place, and time.      ED Treatments / Results  Labs (all labs ordered are listed, but only abnormal results are displayed) Labs Reviewed  COMPREHENSIVE METABOLIC PANEL - Abnormal; Notable for the following components:      Result Value   Glucose, Bld 125 (*)    Creatinine, Ser 1.51 (*)    Calcium 8.8 (*)    GFR calc non Af Amer 51 (*)    GFR calc Af Amer 59 (*)    All other components within normal limits    CBC WITH DIFFERENTIAL/PLATELET  TROPONIN I  BRAIN NATRIURETIC PEPTIDE    EKG EKG Interpretation  Date/Time:  Sunday July 04 2018 19:10:37 EST Ventricular Rate:  81 PR Interval:    QRS Duration: 89 QT Interval:  389 QTC Calculation: 452 R Axis:   49 Text Interpretation:  Sinus rhythm Anterior infarct, old Baseline wander in lead(s) V6 Confirmed by Islam Villescas ([id number]) on 07/04/2018 7:37:27 PM   Radiology Dg Chest 2 View  Result Date: 07/04/2018 CLINICAL DATA:  Acute onset of LEFT-sided chest pain and shortness of breath that began this morning. EXAM: CHEST - 2 VIEW COMPARISON:  None. FINDINGS: AP ERECT and LATERAL images were obtained. Suboptimal inspiration which accounts for crowded bronchovascular markings at the bases. Cardiomediastinal silhouette unremarkable for the AP technique. Lungs clear. Bronchovascular markings normal. Pulmonary vascularity normal. No visible pleural effusions. No pneumothorax. Visualized bony thorax intact. IMPRESSION: Suboptimal inspiration.  No acute cardiopulmonary disease. Electronically Signed   By: Thomas  Lawrence M.D.   On: 07/04/2018 20:28    Procedures Procedures (including critical care time)  Medications Ordered in ED Medications  albuterol (PROVENTIL) (2.5 MG/3ML) 0.083% nebulizer solution 5 mg (5 mg Nebulization Given 07/04/18 2135)  predniSONE (DELTASONE) tablet 60 mg (60 mg Oral Given 07/04/18 2136)     Initial Impression / Assessment and Plan / ED Course  I have reviewed the triage vital signs and the nursing notes.  Pertinent labs & imaging results that were available during my care of the patient were reviewed by me and considered in my medical decision making (see chart for details).     11:20 PM Improved substantially, with breathing treatment. With no evidence for pneumonia, bacteremia, sepsis, decompensated heart failure, some suspicion for acute bronchitis, patient will be discharged, with ongoing steroids,  albuterol, outpatient follow-up.  Final Clinical Impressions(s) / ED Diagnoses   Final diagnoses:  Bronchitis    ED Discharge Orders         Ordered    predniSONE (DELTASONE) 20 MG tablet  Daily with breakfast     07/04/18 2243    albuterol (PROVENTIL HFA;VENTOLIN HFA) 108 (90 Base) MCG/ACT inhaler  Every 6 hours PRN     01 /12/20 2243           Gerhard Munch, MD 07/04/18 2320

## 2018-07-04 NOTE — ED Notes (Signed)
Nurse starting IV and will draw labs. 

## 2018-08-01 ENCOUNTER — Other Ambulatory Visit: Payer: Self-pay

## 2018-08-01 ENCOUNTER — Emergency Department (HOSPITAL_COMMUNITY)
Admission: EM | Admit: 2018-08-01 | Discharge: 2018-08-01 | Disposition: A | Discharge status: Home / Self Care | Payer: Self-pay | Attending: Emergency Medicine | Admitting: Emergency Medicine

## 2018-08-01 ENCOUNTER — Encounter (HOSPITAL_COMMUNITY): Payer: Self-pay

## 2018-08-01 DIAGNOSIS — Z87891 Personal history of nicotine dependence: Secondary | ICD-10-CM | POA: Insufficient documentation

## 2018-08-01 DIAGNOSIS — M109 Gout, unspecified: Secondary | ICD-10-CM | POA: Insufficient documentation

## 2018-08-01 DIAGNOSIS — Z79899 Other long term (current) drug therapy: Secondary | ICD-10-CM | POA: Insufficient documentation

## 2018-08-01 DIAGNOSIS — I10 Essential (primary) hypertension: Secondary | ICD-10-CM | POA: Insufficient documentation

## 2018-08-01 MED ORDER — PREDNISONE 20 MG PO TABS: 40.0000 mg | ORAL_TABLET | Freq: Every day | ORAL | 0 refills | Status: AC

## 2018-08-01 MED ORDER — COLCHICINE 0.6 MG PO TABS
1.2000 mg | ORAL_TABLET | Freq: Once | ORAL | Status: AC
  Administered 2018-08-01: 1.2 mg via ORAL
  Filled 2018-08-01: qty 2

## 2018-08-01 MED ORDER — DEXAMETHASONE SODIUM PHOSPHATE 10 MG/ML IJ SOLN
10.0000 mg | Freq: Once | INTRAMUSCULAR | Status: AC
  Administered 2018-08-01: 10 mg via INTRAMUSCULAR
  Filled 2018-08-01: qty 1

## 2018-08-01 MED ORDER — INDOMETHACIN 50 MG PO CAPS: 50.0000 mg | ORAL_CAPSULE | Freq: Two times a day (BID) | ORAL | 0 refills | Status: AC

## 2018-08-01 MED ORDER — INDOMETHACIN 25 MG PO CAPS: 50.0000 mg | ORAL_CAPSULE | Freq: Once | ORAL | Status: DC

## 2018-08-01 NOTE — Discharge Instructions (Addendum)
At your visit last month it was found that your kidney function was not quite normal.  It has not been normal in the past but it was a little less normal.  This means that you may have some progressive kidney dysfunction, it means you need to drink lots of liquids and have your family doctor recheck this test in 1 week after you have finished the medications  Please be aware that your kidneys may suffer further damage from taking these medications.  You should only take them for 1 week and no longer, drink plenty of fluids and have your doctor recheck your kidney function in 1 week's time.  If you should develop fevers or severe or worsening swelling or pain come back to the emergency department.

## 2018-08-01 NOTE — ED Provider Notes (Signed)
[facility] EMERGENCY DEPARTMENT Provider Note   CSN: [id number] Arrival date & time: 08/01/18  1512     History   Chief Complaint Chief Complaint  Patient presents with  . Gout    HPI Jeffrey Rogers is a 58 y.o. male.  HPI  The patient is a 58 year old male, he has a known history of high blood pressure for which she is prescribed hydrochlorothiazide and amlodipine.  He reports that he is compliant with these medications however he has not had his indomethacin which she had taken in the past for his gout.  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  Nothing seems to make this better, it is worse with trying to move it.  It is not associated with fevers or redness or injury rash or blistering.  He states it feels exactly the same as prior gout attacks and has had multiple gout attacks in his elbow wrist and hand in the past.  Most recently he was on prednisone within the last month because of a bronchitis but states that prior to that it had been sometime.  Past Medical History:  Diagnosis Date  . Hypertension     Patient Active Problem List   Diagnosis Date Noted  . Anaphylactic reaction 12/05/2017    Past Surgical History:  Procedure Laterality Date  . ORBITAL RECONSTRUCTION Right 1981        Home Medications    Prior to Admission medications   Medication Sig Start Date End Date Taking? Authorizing Provider  albuterol (PROVENTIL HFA;VENTOLIN HFA) 108 (90 Base) MCG/ACT inhaler Inhale 1-2 puffs into the lungs every 6 (six) hours as needed for wheezing or shortness of breath. 07/04/18   Gerhard Munch, MD  amLODipine (NORVASC) 5 MG tablet Take 1 tablet (5 mg total) by mouth daily. 12/06/17 12/06/18  Lanelle Bal, MD  hydrochlorothiazide (HYDRODIURIL) 25 MG tablet Take 1 tablet (25 mg total) by  mouth daily. 12/06/17   Lanelle Bal, MD  indomethacin (INDOCIN) 50 MG capsule Take 1 capsule (50 mg total) by mouth 2 (two) times daily with a meal for 7 days. 08/01/18 08/08/18  Eber Hong, MD  predniSONE (DELTASONE) 20 MG tablet Take 2 tablets (40 mg total) by mouth daily. 08/01/18   Eber Hong, MD    Family History No family history on file.  Social History Social History   Tobacco Use  . Smoking status: Former Smoker    Types: Cigarettes    Last attempt to quit: 2018    Years since quitting: 2.1  . Smokeless tobacco: Never Used  Substance Use Topics  . Alcohol use: Never    Frequency: Never  . Drug use: Never     Allergies   Lisinopril   Review of Systems Review of Systems  Constitutional: Negative for fever.  Musculoskeletal: Positive for arthralgias.  Skin: Negative for color change and rash.     Physical Exam Updated Vital Signs BP (!) 169/122   Pulse (!) 102   Temp 97.7 F (36.5 C) (Oral)   Resp 20   SpO2 97%   Physical Exam Vitals signs and nursing note reviewed.  Constitutional:      Appearance: He is well-developed. He is not diaphoretic.  HENT:     Head: Normocephalic and atraumatic.  Eyes:     General:  Right eye: No discharge.        Left eye: No discharge.     Conjunctiva/sclera: Conjunctivae normal.  Pulmonary:     Effort: Pulmonary effort is normal. No respiratory distress.  Musculoskeletal:        General: Swelling and tenderness present. No deformity or signs of injury.     Right lower leg: No edema.     Left lower leg: No edema.     Comments: Left hand wrist and elbow with pain with palpation and range of motion.  The joints are not warm, there is decreased range of motion of both the wrist and the elbow secondary to pain.  The right upper extremity and the bilateral lower extremities are without deficit.  Skin:    General: Skin is warm and dry.     Findings: No erythema or rash.  Neurological:     Mental Status: He  is alert.     Coordination: Coordination normal.      ED Treatments / Results  Labs (all labs ordered are listed, but only abnormal results are displayed) Labs Reviewed - No data to display  EKG None  Radiology No results found.  Procedures Procedures (including critical care time)  Medications Ordered in ED Medications  dexamethasone (DECADRON) injection 10 mg (has no administration in time range)  colchicine tablet 1.2 mg (has no administration in time range)     Initial Impression / Assessment and Plan / ED Course  I have reviewed the triage vital signs and the nursing notes.  Pertinent labs & imaging results that were available during my care of the patient were reviewed by me and considered in my medical decision making (see chart for details).     No fever, no redness or warmth, there is mild swelling to these areas as well as tenderness with any range of motion raising the suspicion for acute gouty arthritis given his significant history.  Without fever I think this is reasonable.  We will give him a dose of colchicine and Decadron and prescribed both prednisone and indomethacin for home.  The patient is agreeable.  Of note he did have some renal insufficiency at his last visit 4 weeks ago.  This will need to be rechecked in a follow-up within the next 2 weeks, the patient was informed of this today.  He is agreeable.  He had been admitted in June 2019 with what it appeared to be either angioedema or anaphylaxis thought to be related to his ACE inhibitor.  This medication was discontinued at that time.  He will be given only a limited amount of Indocin so as not to further worsen his renal function.  Final Clinical Impressions(s) / ED Diagnoses   Final diagnoses:  Gout, arthritis    ED Discharge Orders         Ordered    indomethacin (INDOCIN) 50 MG capsule  2 times daily with meals     08/01/18 1527    predniSONE (DELTASONE) 20 MG tablet  Daily     08/01/18  1527           Eber Hong, MD 08/01/18 1528

## 2018-08-01 NOTE — ED Triage Notes (Signed)
Pt reports gout flare up in his left wrist, swelling and pain noted.

## 2018-09-28 ENCOUNTER — Other Ambulatory Visit: Payer: Self-pay

## 2018-09-28 NOTE — Telephone Encounter (Signed)
rtc to [facility], informed him that [facility] is not pt's [facility]

## 2018-09-28 NOTE — Telephone Encounter (Signed)
Mellody Dance with [employer] medical exam requesting to speak with a nurse about meds. Please call back.

## 2018-10-22 DEATH — deceased

## 2019-07-29 IMAGING — CR DG CHEST 2V
2 series · 2 of 2 positions shown · non-contrast
Comparison: None.

CLINICAL DATA: Acute onset of LEFT-sided chest pain and shortness
of breath that began this morning.

EXAM:
CHEST - 2 VIEW

[chest ap]
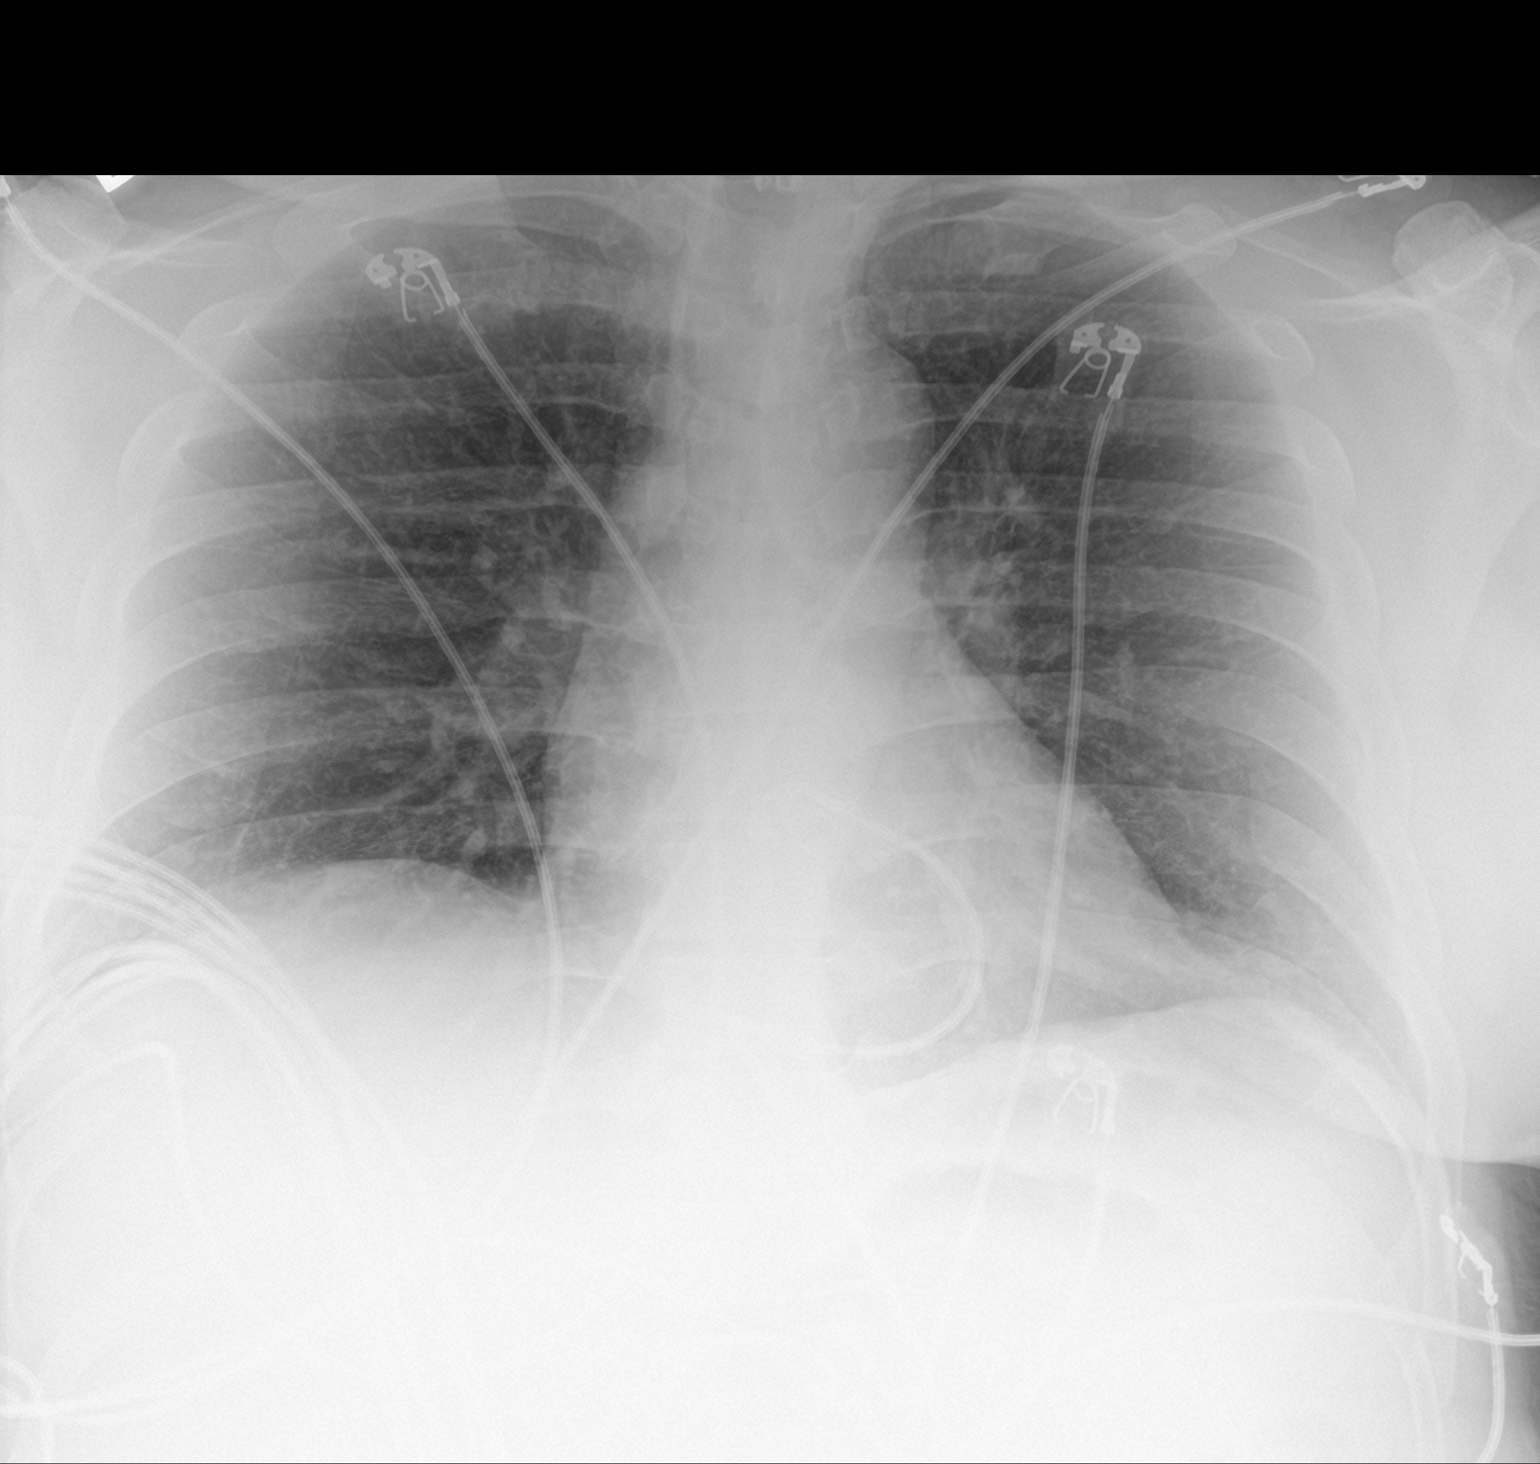

[chest lat]
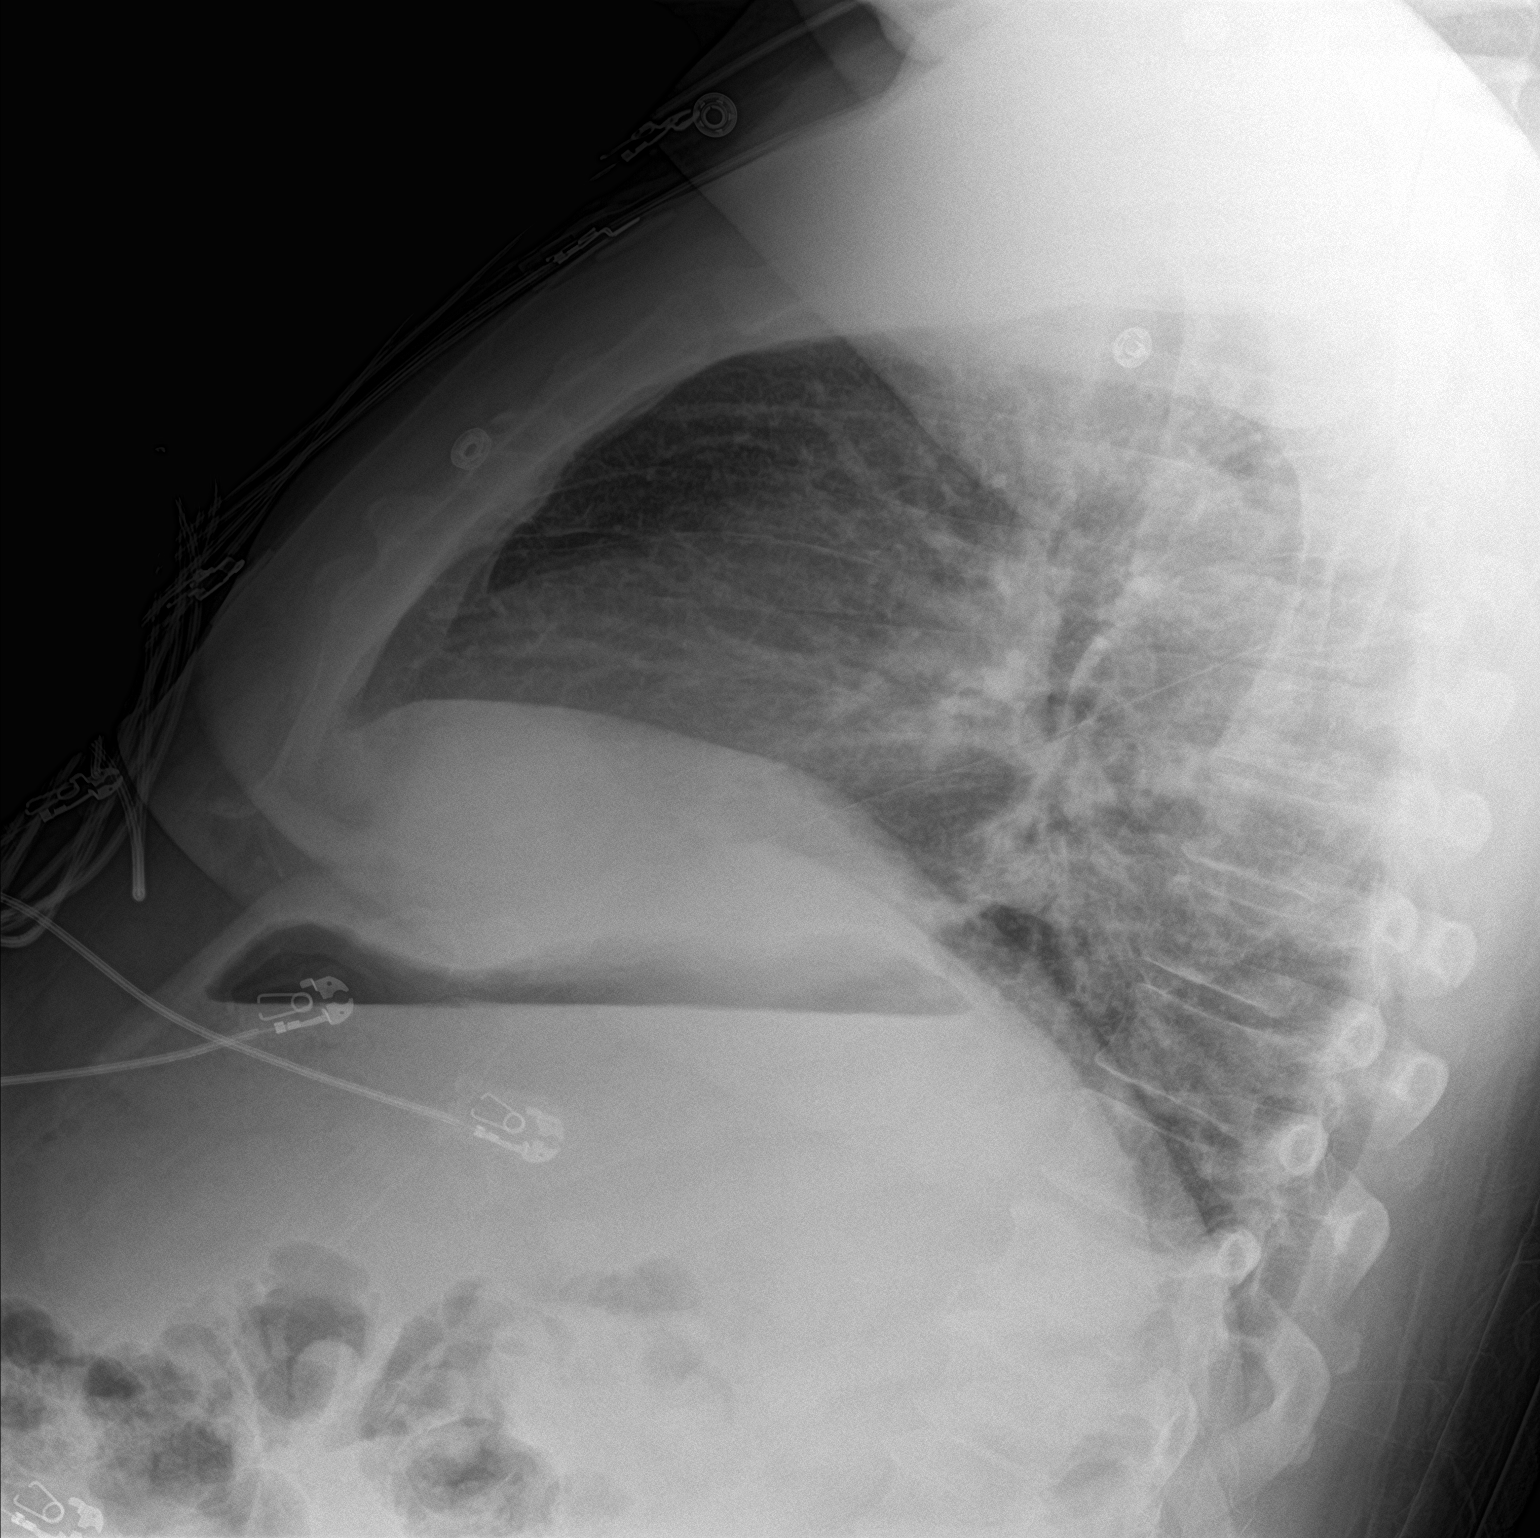

[2 of 2 positions shown; findings below may reference images not displayed]

FINDINGS: AP ERECT and LATERAL images were obtained. Suboptimal inspiration
which accounts for crowded bronchovascular markings at the bases.
Cardiomediastinal silhouette unremarkable for the AP technique.
Lungs clear. Bronchovascular markings normal. Pulmonary vascularity
normal. No visible pleural effusions. No pneumothorax. Visualized
bony thorax intact.
IMPRESSION: Suboptimal inspiration.  No acute cardiopulmonary disease.
